# Patient Record
Sex: Male | Born: 1965 | Race: White | Hispanic: No | Marital: Married | Smoking: Never smoker
Health system: Southern US, Community
[De-identification: ages and names within clinical notes are randomized; demographics above are authoritative.]

## PROBLEM LIST (undated history)

## (undated) DIAGNOSIS — N189 Chronic kidney disease, unspecified: Secondary | ICD-10-CM

## (undated) DIAGNOSIS — M109 Gout, unspecified: Secondary | ICD-10-CM

## (undated) DIAGNOSIS — E785 Hyperlipidemia, unspecified: Secondary | ICD-10-CM

## (undated) DIAGNOSIS — I10 Essential (primary) hypertension: Secondary | ICD-10-CM

## (undated) HISTORY — DX: Essential (primary) hypertension: I10

## (undated) HISTORY — DX: Hyperlipidemia, unspecified: E78.5

---

## 2010-07-18 HISTORY — PX: KNEE ARTHROSCOPY: SUR90

## 2010-10-01 ENCOUNTER — Emergency Department (HOSPITAL_COMMUNITY): Payer: BC Managed Care – PPO

## 2010-10-01 ENCOUNTER — Observation Stay (HOSPITAL_COMMUNITY)
Admission: EM | Admit: 2010-10-01 | Discharge: 2010-10-02 | Disposition: A | Discharge status: Home / Self Care | Payer: BC Managed Care – PPO | Attending: Urology | Admitting: Urology

## 2010-10-01 DIAGNOSIS — E7800 Pure hypercholesterolemia, unspecified: Secondary | ICD-10-CM | POA: Insufficient documentation

## 2010-10-01 DIAGNOSIS — N200 Calculus of kidney: Secondary | ICD-10-CM | POA: Insufficient documentation

## 2010-10-01 DIAGNOSIS — M109 Gout, unspecified: Secondary | ICD-10-CM | POA: Insufficient documentation

## 2010-10-01 DIAGNOSIS — N201 Calculus of ureter: Principal | ICD-10-CM | POA: Insufficient documentation

## 2010-10-01 LAB — BASIC METABOLIC PANEL
Chloride: 101 mEq/L (ref 96–112)
GFR calc non Af Amer: 58 mL/min — ABNORMAL LOW (ref >60)
Glucose, Bld: 97 mg/dL (ref 70–99)
Potassium: 4.1 mEq/L (ref 3.5–5.1)
Sodium: 138 mEq/L (ref 135–145)

## 2010-10-01 LAB — URINALYSIS, ROUTINE W REFLEX MICROSCOPIC
Glucose, UA: NEGATIVE mg/dL
Hgb urine dipstick: NEGATIVE
Protein, ur: NEGATIVE mg/dL
Specific Gravity, Urine: 1.021 (ref 1.005–1.030)

## 2010-10-01 LAB — CBC
HCT: 39.9 % (ref 39.0–52.0)
Platelets: 267 10*3/uL (ref 150–400)
RBC: 4.35 MIL/uL (ref 4.22–5.81)
RDW: 12 % (ref 11.5–15.5)
WBC: 13.7 10*3/uL — ABNORMAL HIGH (ref 4.0–10.5)

## 2010-10-02 LAB — URINE CULTURE

## 2010-10-02 LAB — SURGICAL PCR SCREEN: Staphylococcus aureus: POSITIVE — AB

## 2010-10-02 LAB — TYPE AND SCREEN
ABO/RH(D): A POS
Antibody Screen: NEGATIVE

## 2010-10-03 NOTE — Op Note (Signed)
  [name], [name]                ACCOUNT NO.:  [id number]  MEDICAL RECORD NO.:  [id number]           PATIENT TYPE:  O  LOCATION:  1413                         FACILITY:  [facility]  PHYSICIAN:  Heloise Purpura, MD      DATE OF BIRTH:  March 13, 1966  DATE OF PROCEDURE:  10/02/2010 DATE OF DISCHARGE:                              OPERATIVE REPORT   PREOPERATIVE DIAGNOSIS:  Left ureteropelvic junction calculus.  POSTOPERATIVE DIAGNOSIS:  Left ureteropelvic junction calculus.  PROCEDURES: 1. Cystoscopy. 2. Left ureteral stent placement (5 x 24 Polaris).  SURGEON:  Heloise Purpura, MD  ANESTHESIA:  General.  COMPLICATIONS:  None.  INDICATIONS:  David Golden is a 45 year old gentleman who presented with an 8 x 10 mm left UPJ calculus.  Due to persistent nausea and vomiting as well as uncontrolled pain, he was admitted for intravenous fluid hydration and IV pain control.  His pain has persisted and he has elected to proceed with cystoscopy and ureteral stent placement followed by definitive treatment of his stone with shock wave lithotripsy.  The potential risks, complications, and alternative treatment options have been discussed in detail and informed consent obtained.  DESCRIPTION OF PROCEDURE:  The patient was taken to the operating room and a general anesthetic was administered.  He was given preoperative antibiotics, placed in the dorsal lithotomy position, and prepped and draped in the usual sterile fashion.  Next, a preoperative time-out was performed.  Cystourethroscopy was then performed which revealed a normal anterior and posterior urethra.  The distal urethra was mildly narrowed in terms of its caliber and did require serial dilation with Sissy Hoff sounds from 16 up to 24 [place].  The 22-French cystoscope sheath was then placed easily within the bladder and the bladder was systematically examined.  The ureteral orifices were in their normal anatomic positions and no evidence of  any bladder tumors, stones, or other mucosal pathology was identified.  The left ureteral orifice was then identified and intubated with a 6-French ureteral catheter.  The patient's UPJ stone was well visualized on fluoroscopy.  A 0.038 sensor guidewire was then advanced up the ureter into the renal pelvis past the stone without any resistance or difficulty.  A 5 x 24 double-J ureteral Polaris stent was then advanced over the wire using Seldinger technique and positioned appropriately under fluoroscopic and cystoscopic guidance.  The wire was then removed.  The stent was noted to be in the correct position and the bladder was emptied.  He tolerated the procedure well without complications.     Heloise Purpura, MD     LB/MEDQ  D:  10/02/2010  T:  10/02/2010  Job:  [id number]  Electronically Signed by Heloise Purpura MD on 10/03/2010 04:44:51 PM

## 2010-10-03 NOTE — Discharge Summary (Signed)
  NAMEGREER, David Golden                ACCOUNT NO.:  [id number]  MEDICAL RECORD NO.:  [id number]           PATIENT TYPE:  O  LOCATION:  1413                         FACILITY:  [facility]  PHYSICIAN:  Heloise Purpura, MD      DATE OF BIRTH:  [birth year]  DATE OF ADMISSION:  10/01/2010 DATE OF DISCHARGE:  10/02/2010                              DISCHARGE SUMMARY   ADMISSION DIAGNOSES: 1. Left ureteropelvic junction calculus. 2. Bilateral renal calculi.  DISCHARGE DIAGNOSES: 1. Left ureteropelvic junction calculus. 2. Bilateral renal calculi.  HISTORY AND PHYSICAL:  For full details, please see admission history and physical.  Briefly, [name] is a 45 year old gentleman with a longstanding history of urolithiasis.  He presented to the emergency department with acute left-sided flank pain and was found to have a 10 x 8 mm left UPJ calculus along with nonobstructing bilateral renal calculi.  He was admitted for IV fluid hydration and IV pain control. Due to his persistent pain, he subsequently underwent left ureteral stent placement under cystoscopic guidance on October 02, 2010.  His pain was improving and he was subsequently able to be discharged home on October 02, 2010, with oral pain medication.  DISPOSITION:  Home.  DISCHARGE MEDICATIONS:  He will resume his regular home medications.  He has been provided a prescription for Percocet to take as needed for pain and promethazine for nausea.  DISCHARGE INSTRUCTIONS:  He was instructed to reduce his normal diet and activity.  FOLLOWUP:  He will be scheduled for shock wave lithotripsy for definitive treatment of his stone, as previously discussed.     Heloise Purpura, MD     LB/MEDQ  D:  10/02/2010  T:  10/03/2010  Job:  [id number]  Electronically Signed by Heloise Purpura MD on 10/03/2010 04:44:54 PM

## 2010-10-03 NOTE — Consult Note (Signed)
David Golden, David Golden                ACCOUNT NO.:  [id number]  MEDICAL RECORD NO.:  [id number]           PATIENT TYPE:  O  LOCATION:  1413                         FACILITY:  [facility]  PHYSICIAN:  Heloise Purpura, MD      DATE OF BIRTH:  [birth year]  DATE OF CONSULTATION:  10/01/2010 DATE OF DISCHARGE:                                CONSULTATION   REQUESTING PHYSICIAN:  Carleene Cooper, M.D.  REASON FOR CONSULTATION:  Left ureteral calculus.  HISTORY:  David Golden is a 45 year old gentleman from [place] with a longstanding history of kidney stones.  He has had multiple kidney stones and estimates approximately 15 separate episodes over the course of his lifetime.  He has spontaneously passed most of his stones including one as recently as last month.  However, he has required surgical intervention with shock wave lithotripsy twice previously and did require a ureteral stent, one of those times.  His stone type apparently has been calcium oxalate.  His most recent episode began approximately 24 hours ago when he began having severe left-sided flank pain.  This did feel somewhat similar to his prior stone episodes and he developed associated nausea and vomiting.  His pain was uncontrolled and he presented to the emergency department for further evaluation.  He has denied associated hematuria or fever.  PAST MEDICAL HISTORY: 1. Gout. 2. Borderline hypertension. 3. Hypercholesterolemia.  PAST SURGICAL HISTORY: 1. Shock wave lithotripsy for 2 times. 2. Right knee arthroscopy.  MEDICATIONS:  Uloric.  ALLERGIES:  No known drug allergies.  FAMILY HISTORY:  There is a strong family history with multiple family members having a history of urolithiasis.  SOCIAL HISTORY:  He is married and has 2 sons, ages 43 and 78.  He denies alcohol or tobacco use.  REVIEW OF SYSTEMS:  A complete review of systems was reviewed. Pertinent positives are included in the history of present illness.   All other systems are reviewed and otherwise negative.  PHYSICAL EXAMINATION:  GENERAL:  He is currently afebrile with stable vital signs. CONSTITUTIONAL:  Alert and oriented, in no acute distress. HEENT:  Normocephalic, atraumatic. NECK:  Supple. CARDIOVASCULAR:  Regular rate and rhythm without obvious murmurs. LUNGS:  Clear bilaterally. ABDOMEN:  Soft, nontender, and nondistended without abdominal masses or bruits. BACK:  Mild CVA tenderness on the left side.  Of note, he did receive IV Dilaudid within the past hour. EXTREMITIES:  No edema. NEUROLOGIC:  Grossly intact.  LABORATORY DATA:  Serum creatinine 1.34.  White blood count 13.4. Urinalysis is dipstick negative.  RADIOLOGIC IMAGING:  I independently reviewed his CT scan, which demonstrates multiple bilateral renal calculi, which were nonobstructing.  He has an 8 x 10 mm left UPJ calculus with associated left hydronephrosis, which is obstructing.  I also independently reviewed a KUB, which was performed today, which reveals his stones to be radiopaque including the ureteropelvic junction stone, which is well visualized on plain x-ray.  IMPRESSION:  Left ureteropelvic junction calculus.  PLAN:  I have discussed options with the patient, which would include acute management with either outpatient oral medical therapy and pain control versus proceeding  with a ureteral stent.  We have also discussed definitive treatment options including shock wave lithotripsy and ureteroscopic laser lithotripsy.  He would like to proceed with shock wave lithotripsy for definitive management.  He feels that his pain is under relatively good control.  We have reviewed the appropriate stone precautions and he knows to call should he develop uncontrolled pain, persistent nausea and vomiting, or fever greater than 101 degrees Fahrenheit.  He has been prescribed Percocet for pain and promethazine for nausea.  We did review the potential risks  and complications associated with shock wave lithotripsy, which include bleeding, infection, perirenal hematoma, and failure to adequately fragment the stone, etc.  He feels well informed and does elect to proceed in this fashion.  This will be scheduled in the near future.     Heloise Purpura, MD     LB/MEDQ  D:  10/01/2010  T:  10/02/2010  Job:  [id number]  cc:   David Golden, M.D. Fax: [phone]  Electronically Signed by Heloise Purpura MD on 10/03/2010 04:44:47 PM

## 2010-10-07 ENCOUNTER — Ambulatory Visit (HOSPITAL_COMMUNITY): Payer: BC Managed Care – PPO

## 2010-10-07 ENCOUNTER — Ambulatory Visit (HOSPITAL_COMMUNITY)
Admission: RE | Admit: 2010-10-07 | Discharge: 2010-10-07 | Disposition: A | Discharge status: Home / Self Care | Payer: BC Managed Care – PPO | Source: Ambulatory Visit | Attending: Urology | Admitting: Urology

## 2010-10-07 DIAGNOSIS — N200 Calculus of kidney: Secondary | ICD-10-CM | POA: Insufficient documentation

## 2011-09-16 HISTORY — PX: CYSTOSCOPY WITH STENT PLACEMENT: SHX5790

## 2012-11-23 ENCOUNTER — Other Ambulatory Visit: Payer: Self-pay | Admitting: Urology

## 2012-12-26 ENCOUNTER — Encounter (HOSPITAL_COMMUNITY): Payer: Self-pay | Admitting: Pharmacy Technician

## 2012-12-27 ENCOUNTER — Encounter (HOSPITAL_COMMUNITY): Payer: Self-pay | Admitting: *Deleted

## 2012-12-29 NOTE — H&P (Signed)
  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   Prior treatments: Mar 2012: L ESWL (10 mm L UPJ stone)    Past Medical History Problems  1. History of  Gout 274.9 2. History of  Hypercholesterolemia 272.0 3. History of  Oral Surgery Tooth Extraction  Surgical History Problems  1. History of  Arthroscopy Knee Right 2. History of  Cystoscopy With Insertion Of Ureteral Stent Left 3. History of  Knee Arthroscopy 4. History of  Lithotripsy  Current Meds 1. Advil TABS; as needed; Therapy: (Recorded:28Mar2013) to 2. Hydrocodone-Acetaminophen 5-500 MG Oral Tablet; Therapy: (Recorded:28Mar2013) to 3. Multivitamins TABS; Therapy: (Recorded:28Mar2012) to 4. Oxycodone-Acetaminophen 5-325 MG Oral Tablet; TAKE 1 TO 2 TABLETS EVERY 4 HOURS AS  NEEDED FOR PAIN; Therapy: 28Mar2013 to (Last Rx:28Mar2013) 5. Promethazine HCl 25 MG Oral Tablet; TAKE 1 TABLET EVERY 4 TO 6 HOURS AS NEEDED;  Therapy: 28Mar2013 to (Evaluate:04Apr2013)  Requested for: 28Mar2013; Last Rx:28Mar2013  Allergies Medication  1. No Known Drug Allergies  Family History Problems  1. Paternal history of  Acute Myelogenous Leukemia V16.6 2. Paternal history of  Coronary Artery Disease V17.49 3. Maternal history of  Urinary Calculus  Social History Problems  1. Marital History - Currently Married 2. Never A Smoker Denied  3. History of  Alcohol Use 4. History of  Tobacco Use  Vitals  BMI Calculated: 29.12 BSA Calculated: 2.2 Height: 6 ft  Weight: 215 lb    Physical Exam Constitutional: Well nourished and well developed . No acute distress.  ENT:. The ears and nose are normal in appearance.  Neck: The appearance of the neck is normal and no neck mass is  present.  Pulmonary: No respiratory distress and normal respiratory rhythm and effort.  Cardiovascular: Heart rate and rhythm are normal . No peripheral edema.  Abdomen: The abdomen is soft and nontender. No masses are palpated. No CVA tenderness. No hernias are palpable. No hepatosplenomegaly noted.  Neuro/Psych:. Mood and affect are appropriate.    Results/Data Urine [Data Includes: Last 1 Day]   11Apr2014  COLOR YELLOW   APPEARANCE CLEAR   SPECIFIC GRAVITY 1.020   pH 6.0   GLUCOSE NEG mg/dL  BILIRUBIN NEG   KETONE NEG mg/dL  BLOOD NEG   PROTEIN NEG mg/dL  UROBILINOGEN 0.2 mg/dL  NITRITE NEG   LEUKOCYTE ESTERASE NEG    There are 2 radiopaque calculi in the right kidney within upper pole calculus measuring 10.7 mm and a lower pole calculus measuring approximately 3 mm. These are nonobstructing positions. No calcifications are seen over the left renal shadow or over the expected course of the ureters bilaterally. In comparison, his right renal calculus measured approximately 8.6 mm last year.   Assessment Assessed  1. Nephrolithiasis 592.0   Discussion/Summary 1. Right renal calculi: He does have evidence of stone growth over the past year. We discussed options including continued observation versus intervention with treatment options including percutaneous nephrolithotomy, ureteroscopic laser lithotripsy, and shockwave lithotripsy. We discussed the pros and cons of each approach and he does wish to proceed with proactive therapy was shockwave lithotripsy. We have reviewed the potential risks, complications, expected success rates, and expected recovery process associated with this procedure.

## 2012-12-31 ENCOUNTER — Ambulatory Visit (HOSPITAL_COMMUNITY)
Admission: RE | Admit: 2012-12-31 | Discharge: 2012-12-31 | Disposition: A | Discharge status: Home / Self Care | Payer: BC Managed Care – PPO | Source: Ambulatory Visit | Attending: Urology | Admitting: Urology

## 2012-12-31 ENCOUNTER — Ambulatory Visit (HOSPITAL_COMMUNITY): Payer: BC Managed Care – PPO

## 2012-12-31 ENCOUNTER — Encounter (HOSPITAL_COMMUNITY)
Admission: RE | Disposition: A | Discharge status: Home / Self Care | Payer: Self-pay | Source: Ambulatory Visit | Attending: Urology

## 2012-12-31 ENCOUNTER — Encounter (HOSPITAL_COMMUNITY): Payer: Self-pay | Admitting: *Deleted

## 2012-12-31 DIAGNOSIS — Z8639 Personal history of other endocrine, nutritional and metabolic disease: Secondary | ICD-10-CM | POA: Insufficient documentation

## 2012-12-31 DIAGNOSIS — Z87442 Personal history of urinary calculi: Secondary | ICD-10-CM | POA: Insufficient documentation

## 2012-12-31 DIAGNOSIS — Z79899 Other long term (current) drug therapy: Secondary | ICD-10-CM | POA: Insufficient documentation

## 2012-12-31 DIAGNOSIS — N200 Calculus of kidney: Secondary | ICD-10-CM | POA: Insufficient documentation

## 2012-12-31 DIAGNOSIS — E7800 Pure hypercholesterolemia, unspecified: Secondary | ICD-10-CM | POA: Insufficient documentation

## 2012-12-31 DIAGNOSIS — Z862 Personal history of diseases of the blood and blood-forming organs and certain disorders involving the immune mechanism: Secondary | ICD-10-CM | POA: Insufficient documentation

## 2012-12-31 HISTORY — DX: Gout, unspecified: M10.9

## 2012-12-31 HISTORY — DX: Chronic kidney disease, unspecified: N18.9

## 2012-12-31 SURGERY — LITHOTRIPSY, ESWL
Anesthesia: LOCAL | Laterality: Right

## 2012-12-31 MED ORDER — CIPROFLOXACIN HCL 500 MG PO TABS
500.0000 mg | ORAL_TABLET | ORAL | Status: AC
  Administered 2012-12-31: 500 mg via ORAL
  Filled 2012-12-31: qty 1

## 2012-12-31 MED ORDER — DIPHENHYDRAMINE HCL 25 MG PO CAPS
25.0000 mg | ORAL_CAPSULE | ORAL | Status: AC
  Administered 2012-12-31: 25 mg via ORAL
  Filled 2012-12-31: qty 1

## 2012-12-31 MED ORDER — DEXTROSE-NACL 5-0.45 % IV SOLN
INTRAVENOUS | Status: DC
  Administered 2012-12-31: 17:00:00 via INTRAVENOUS

## 2012-12-31 MED ORDER — DIAZEPAM 5 MG PO TABS
10.0000 mg | ORAL_TABLET | ORAL | Status: AC
  Administered 2012-12-31: 10 mg via ORAL
  Filled 2012-12-31: qty 2

## 2012-12-31 NOTE — Op Note (Signed)
See paper chart for operative note for ESWL. 

## 2013-03-09 IMAGING — CR DG ABDOMEN 1V
1 series · 1 of 1 positions shown · non-contrast
Comparison: 10/01/2010

CLINICAL DATA: Kidney stones.  Pre lithotripsy.

ABDOMEN - 1 VIEW

[t abdomen supine]
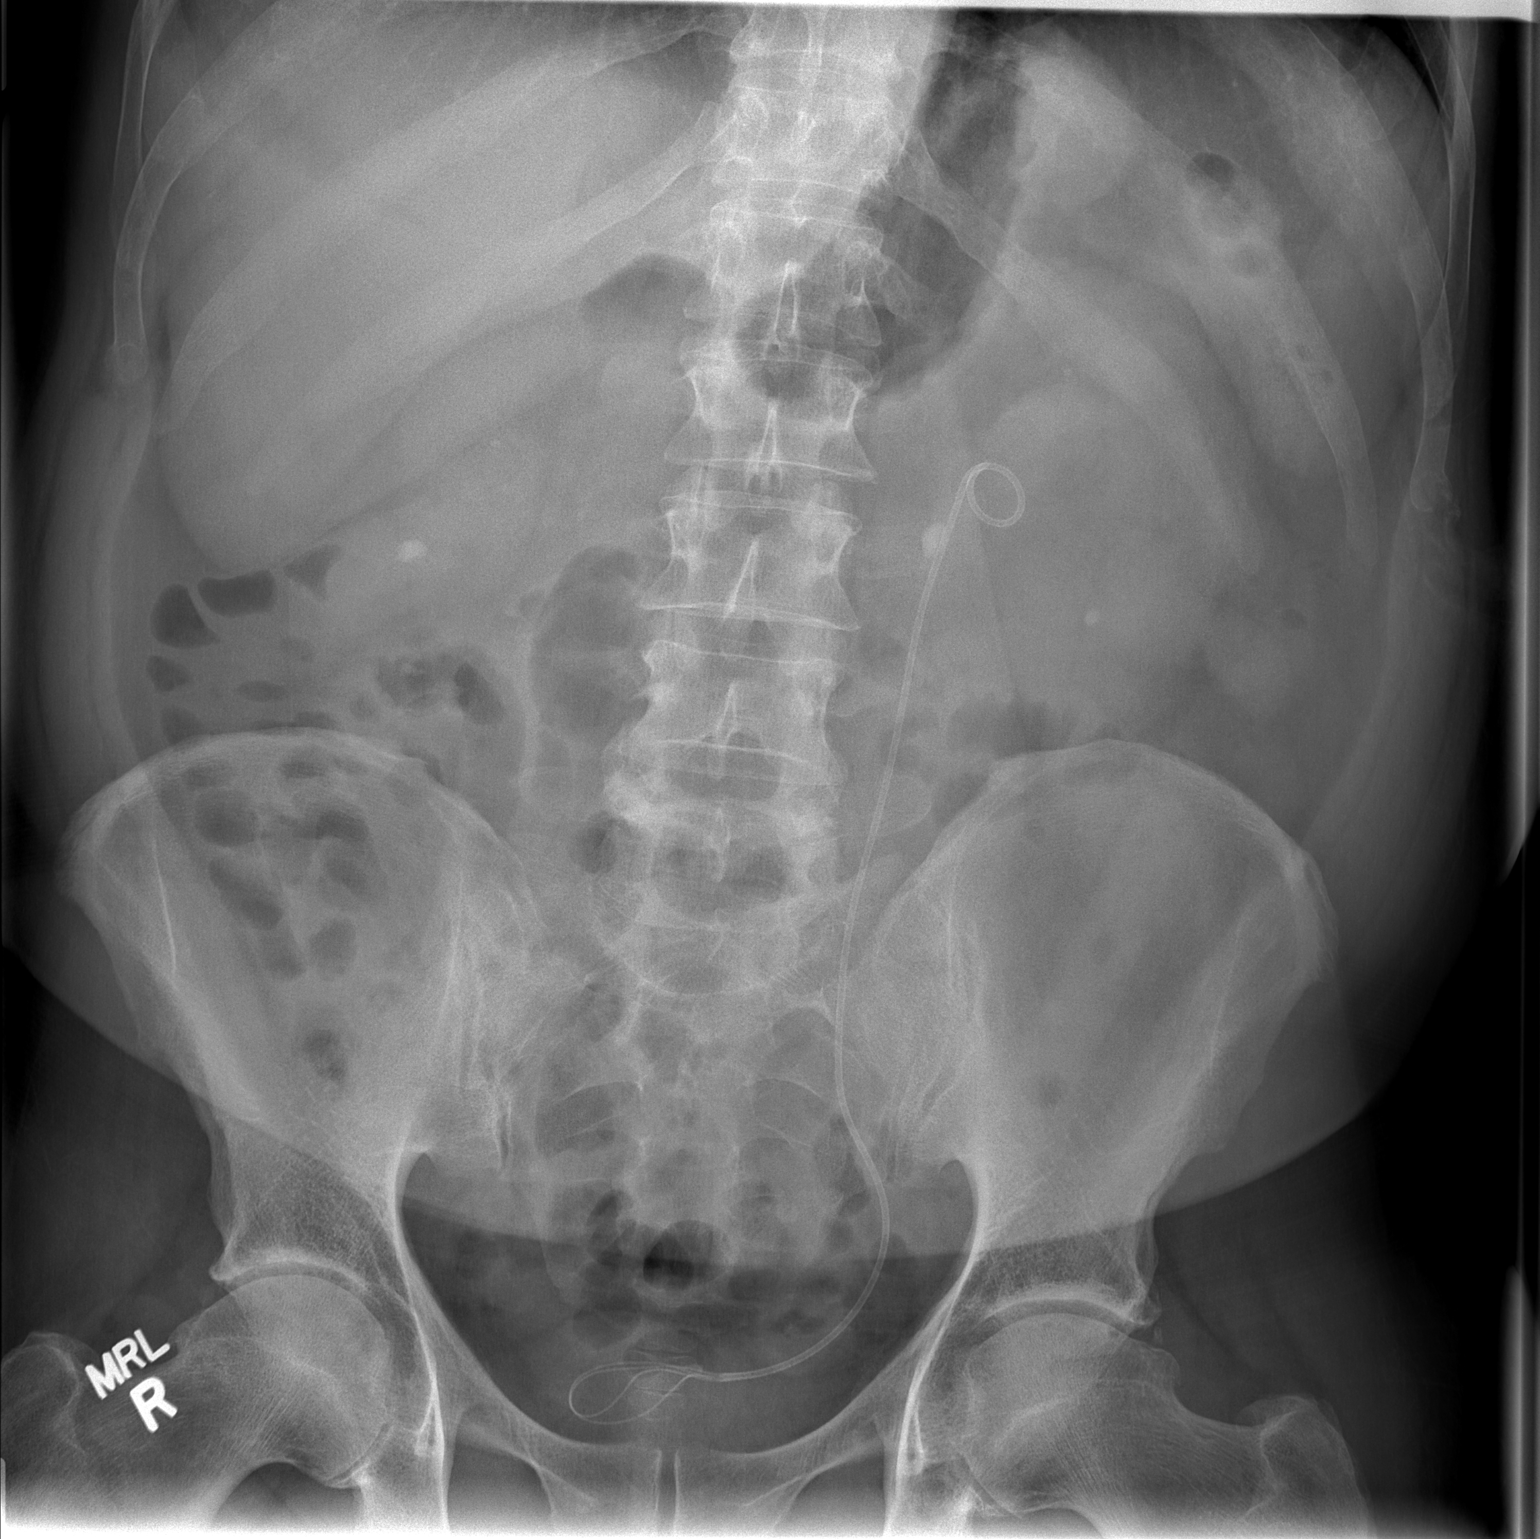

[1 of 1 positions shown; findings below may reference images not displayed]

FINDINGS: A left ureteral stent has been placed.    Proximal aspect
of the stent is reconstituted at the left kidney.  Distal aspect of
stent is in the region of the urinary bladder.  Again seen are
bilateral renal calculi.  There is a stable large stone in the
proximal left ureter that measures up to 1.2 cm.  There is a 0.5 cm
stone in the left kidney lower pole.  1 cm stone overlying the
right kidney.  Nonspecific bowel gas pattern.
IMPRESSION: Stable appearance of bilateral renal stones.

Interval placement of left ureteral stent.

## 2015-06-03 IMAGING — CR DG ABDOMEN 1V
1 series · 1 of 1 positions shown · non-contrast
Comparison: Office radiographs 10/26/2012.  CT urogram 10/13/2011.

CLINICAL DATA: Right renal calculus.  Pre lithotripsy.

ABDOMEN - 1 VIEW

[t abdomen supine]
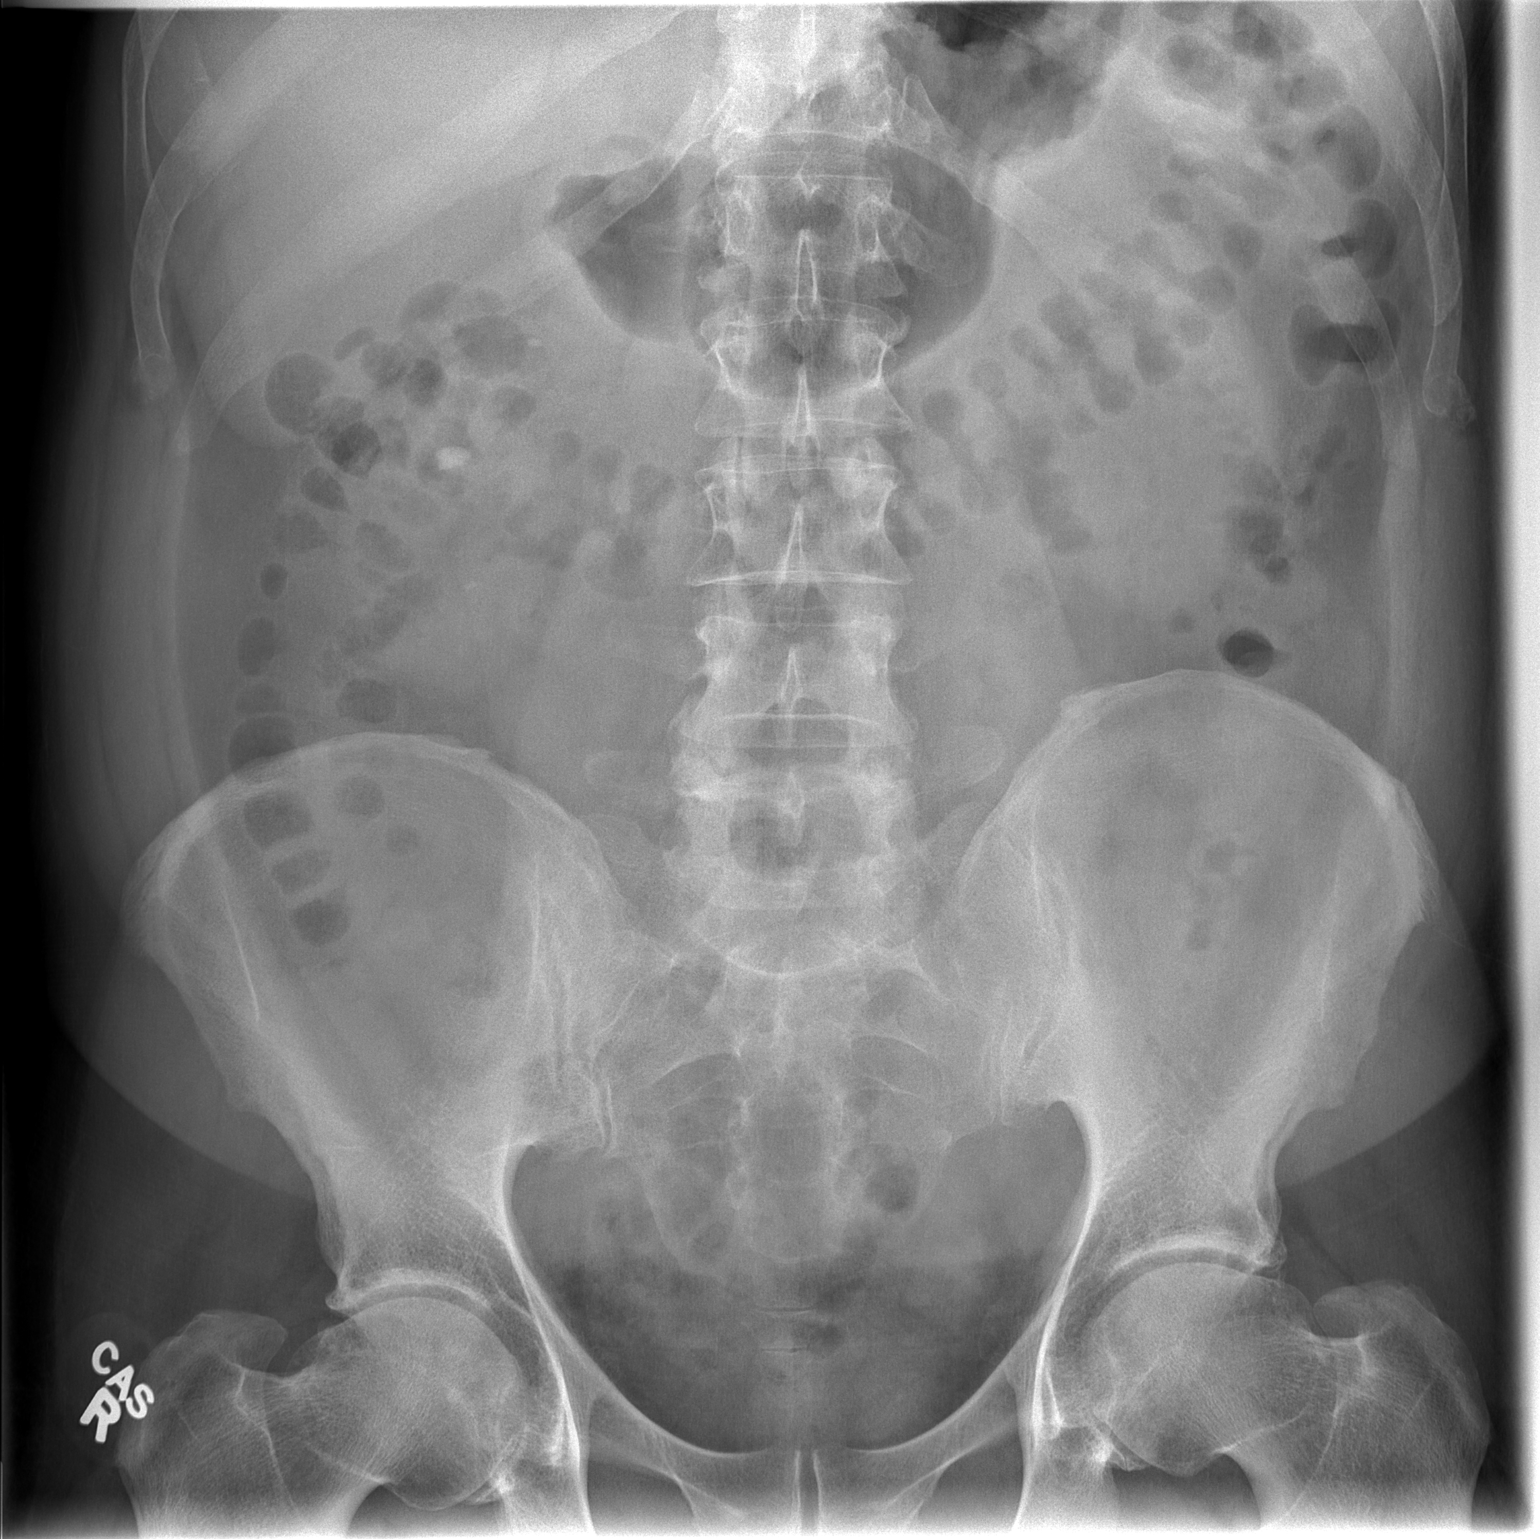

[1 of 1 positions shown; findings below may reference images not displayed]

FINDINGS: Dominant 9 mm calculus in the interpolar region of the
right kidney is unchanged.  There are other smaller calculi in the
upper and lower poles of the right kidney.  Previously noted left
renal calculi on CT are not well visualized.  No ureteral calculi
are evident.  The bowel gas pattern is normal.  Mild degenerative
changes are present in both hips.
IMPRESSION: Stable right renal calculi.  No radiographic evidence of ureteral
calculus.

## 2016-12-19 DIAGNOSIS — E785 Hyperlipidemia, unspecified: Secondary | ICD-10-CM | POA: Diagnosis not present

## 2016-12-19 DIAGNOSIS — Z125 Encounter for screening for malignant neoplasm of prostate: Secondary | ICD-10-CM | POA: Diagnosis not present

## 2016-12-19 DIAGNOSIS — I10 Essential (primary) hypertension: Secondary | ICD-10-CM | POA: Diagnosis not present

## 2016-12-19 DIAGNOSIS — Z79899 Other long term (current) drug therapy: Secondary | ICD-10-CM | POA: Diagnosis not present

## 2017-06-19 DIAGNOSIS — Z2821 Immunization not carried out because of patient refusal: Secondary | ICD-10-CM | POA: Diagnosis not present

## 2017-06-19 DIAGNOSIS — Z79899 Other long term (current) drug therapy: Secondary | ICD-10-CM | POA: Diagnosis not present

## 2017-06-19 DIAGNOSIS — E785 Hyperlipidemia, unspecified: Secondary | ICD-10-CM | POA: Diagnosis not present

## 2017-06-19 DIAGNOSIS — I10 Essential (primary) hypertension: Secondary | ICD-10-CM | POA: Diagnosis not present

## 2017-06-19 DIAGNOSIS — M109 Gout, unspecified: Secondary | ICD-10-CM | POA: Diagnosis not present

## 2017-10-31 DIAGNOSIS — R05 Cough: Secondary | ICD-10-CM | POA: Diagnosis not present

## 2017-10-31 DIAGNOSIS — R6889 Other general symptoms and signs: Secondary | ICD-10-CM | POA: Diagnosis not present

## 2017-12-26 DIAGNOSIS — Z131 Encounter for screening for diabetes mellitus: Secondary | ICD-10-CM | POA: Diagnosis not present

## 2017-12-26 DIAGNOSIS — Z79899 Other long term (current) drug therapy: Secondary | ICD-10-CM | POA: Diagnosis not present

## 2017-12-26 DIAGNOSIS — I10 Essential (primary) hypertension: Secondary | ICD-10-CM | POA: Diagnosis not present

## 2017-12-26 DIAGNOSIS — E785 Hyperlipidemia, unspecified: Secondary | ICD-10-CM | POA: Diagnosis not present

## 2018-06-27 DIAGNOSIS — E785 Hyperlipidemia, unspecified: Secondary | ICD-10-CM | POA: Diagnosis not present

## 2018-06-27 DIAGNOSIS — Z79899 Other long term (current) drug therapy: Secondary | ICD-10-CM | POA: Diagnosis not present

## 2018-06-27 DIAGNOSIS — Z23 Encounter for immunization: Secondary | ICD-10-CM | POA: Diagnosis not present

## 2018-06-27 DIAGNOSIS — Z131 Encounter for screening for diabetes mellitus: Secondary | ICD-10-CM | POA: Diagnosis not present

## 2018-06-27 DIAGNOSIS — I10 Essential (primary) hypertension: Secondary | ICD-10-CM | POA: Diagnosis not present

## 2018-07-04 DIAGNOSIS — Z1212 Encounter for screening for malignant neoplasm of rectum: Secondary | ICD-10-CM | POA: Diagnosis not present

## 2018-07-04 DIAGNOSIS — Z1211 Encounter for screening for malignant neoplasm of colon: Secondary | ICD-10-CM | POA: Diagnosis not present

## 2018-11-02 DIAGNOSIS — M109 Gout, unspecified: Secondary | ICD-10-CM | POA: Diagnosis not present

## 2019-07-01 DIAGNOSIS — E785 Hyperlipidemia, unspecified: Secondary | ICD-10-CM | POA: Diagnosis not present

## 2019-07-01 DIAGNOSIS — I10 Essential (primary) hypertension: Secondary | ICD-10-CM | POA: Diagnosis not present

## 2019-07-01 DIAGNOSIS — Z1331 Encounter for screening for depression: Secondary | ICD-10-CM | POA: Diagnosis not present

## 2019-07-01 DIAGNOSIS — R05 Cough: Secondary | ICD-10-CM | POA: Diagnosis not present

## 2019-07-01 DIAGNOSIS — M109 Gout, unspecified: Secondary | ICD-10-CM | POA: Diagnosis not present

## 2019-07-05 DIAGNOSIS — R05 Cough: Secondary | ICD-10-CM | POA: Diagnosis not present

## 2019-07-05 DIAGNOSIS — J1289 Other viral pneumonia: Secondary | ICD-10-CM | POA: Diagnosis not present

## 2019-07-05 DIAGNOSIS — U071 COVID-19: Secondary | ICD-10-CM | POA: Diagnosis not present

## 2019-07-05 DIAGNOSIS — R0600 Dyspnea, unspecified: Secondary | ICD-10-CM | POA: Diagnosis not present

## 2019-07-07 DIAGNOSIS — B342 Coronavirus infection, unspecified: Secondary | ICD-10-CM | POA: Insufficient documentation

## 2019-07-07 HISTORY — DX: Coronavirus infection, unspecified: B34.2

## 2019-10-15 DIAGNOSIS — Z6830 Body mass index (BMI) 30.0-30.9, adult: Secondary | ICD-10-CM | POA: Diagnosis not present

## 2019-10-15 DIAGNOSIS — E785 Hyperlipidemia, unspecified: Secondary | ICD-10-CM | POA: Diagnosis not present

## 2019-10-15 DIAGNOSIS — I10 Essential (primary) hypertension: Secondary | ICD-10-CM | POA: Diagnosis not present

## 2019-10-15 DIAGNOSIS — M109 Gout, unspecified: Secondary | ICD-10-CM | POA: Diagnosis not present

## 2019-12-30 DIAGNOSIS — J0100 Acute maxillary sinusitis, unspecified: Secondary | ICD-10-CM | POA: Diagnosis not present

## 2019-12-30 DIAGNOSIS — Z79899 Other long term (current) drug therapy: Secondary | ICD-10-CM | POA: Diagnosis not present

## 2020-03-09 DIAGNOSIS — E785 Hyperlipidemia, unspecified: Secondary | ICD-10-CM | POA: Diagnosis not present

## 2020-03-09 DIAGNOSIS — Z79899 Other long term (current) drug therapy: Secondary | ICD-10-CM | POA: Diagnosis not present

## 2020-03-09 DIAGNOSIS — I10 Essential (primary) hypertension: Secondary | ICD-10-CM | POA: Diagnosis not present

## 2020-03-09 DIAGNOSIS — Z6830 Body mass index (BMI) 30.0-30.9, adult: Secondary | ICD-10-CM | POA: Diagnosis not present

## 2020-03-09 DIAGNOSIS — Z125 Encounter for screening for malignant neoplasm of prostate: Secondary | ICD-10-CM | POA: Diagnosis not present

## 2020-07-30 DIAGNOSIS — Z1331 Encounter for screening for depression: Secondary | ICD-10-CM | POA: Diagnosis not present

## 2020-07-30 DIAGNOSIS — Z125 Encounter for screening for malignant neoplasm of prostate: Secondary | ICD-10-CM | POA: Diagnosis not present

## 2020-07-30 DIAGNOSIS — Z79899 Other long term (current) drug therapy: Secondary | ICD-10-CM | POA: Diagnosis not present

## 2020-07-30 DIAGNOSIS — I10 Essential (primary) hypertension: Secondary | ICD-10-CM | POA: Diagnosis not present

## 2020-07-30 DIAGNOSIS — E785 Hyperlipidemia, unspecified: Secondary | ICD-10-CM | POA: Diagnosis not present

## 2020-07-30 DIAGNOSIS — Z23 Encounter for immunization: Secondary | ICD-10-CM | POA: Diagnosis not present

## 2020-08-03 DIAGNOSIS — M25561 Pain in right knee: Secondary | ICD-10-CM | POA: Insufficient documentation

## 2020-08-07 DIAGNOSIS — M1711 Unilateral primary osteoarthritis, right knee: Secondary | ICD-10-CM | POA: Diagnosis not present

## 2020-09-04 DIAGNOSIS — M25561 Pain in right knee: Secondary | ICD-10-CM | POA: Diagnosis not present

## 2020-10-30 DIAGNOSIS — M25561 Pain in right knee: Secondary | ICD-10-CM | POA: Diagnosis not present

## 2020-11-04 DIAGNOSIS — Z6831 Body mass index (BMI) 31.0-31.9, adult: Secondary | ICD-10-CM | POA: Diagnosis not present

## 2020-11-04 DIAGNOSIS — J302 Other seasonal allergic rhinitis: Secondary | ICD-10-CM | POA: Diagnosis not present

## 2020-11-04 DIAGNOSIS — M109 Gout, unspecified: Secondary | ICD-10-CM | POA: Diagnosis not present

## 2020-11-04 DIAGNOSIS — Z79899 Other long term (current) drug therapy: Secondary | ICD-10-CM | POA: Diagnosis not present

## 2020-11-26 DIAGNOSIS — Z79899 Other long term (current) drug therapy: Secondary | ICD-10-CM | POA: Diagnosis not present

## 2020-11-26 DIAGNOSIS — N400 Enlarged prostate without lower urinary tract symptoms: Secondary | ICD-10-CM | POA: Diagnosis not present

## 2020-11-26 DIAGNOSIS — I10 Essential (primary) hypertension: Secondary | ICD-10-CM | POA: Diagnosis not present

## 2020-11-26 DIAGNOSIS — E785 Hyperlipidemia, unspecified: Secondary | ICD-10-CM | POA: Diagnosis not present

## 2021-01-27 DIAGNOSIS — M25561 Pain in right knee: Secondary | ICD-10-CM | POA: Diagnosis not present

## 2021-03-31 DIAGNOSIS — Z79899 Other long term (current) drug therapy: Secondary | ICD-10-CM | POA: Diagnosis not present

## 2021-03-31 DIAGNOSIS — I10 Essential (primary) hypertension: Secondary | ICD-10-CM | POA: Diagnosis not present

## 2021-03-31 DIAGNOSIS — N400 Enlarged prostate without lower urinary tract symptoms: Secondary | ICD-10-CM | POA: Diagnosis not present

## 2021-03-31 DIAGNOSIS — E785 Hyperlipidemia, unspecified: Secondary | ICD-10-CM | POA: Diagnosis not present

## 2021-06-01 DIAGNOSIS — M1711 Unilateral primary osteoarthritis, right knee: Secondary | ICD-10-CM | POA: Insufficient documentation

## 2021-06-03 DIAGNOSIS — M1711 Unilateral primary osteoarthritis, right knee: Secondary | ICD-10-CM | POA: Diagnosis not present

## 2021-06-03 DIAGNOSIS — M170 Bilateral primary osteoarthritis of knee: Secondary | ICD-10-CM | POA: Diagnosis not present

## 2021-08-19 DIAGNOSIS — Z79899 Other long term (current) drug therapy: Secondary | ICD-10-CM | POA: Diagnosis not present

## 2021-08-19 DIAGNOSIS — Z1331 Encounter for screening for depression: Secondary | ICD-10-CM | POA: Diagnosis not present

## 2021-08-19 DIAGNOSIS — N400 Enlarged prostate without lower urinary tract symptoms: Secondary | ICD-10-CM | POA: Diagnosis not present

## 2021-08-19 DIAGNOSIS — E785 Hyperlipidemia, unspecified: Secondary | ICD-10-CM | POA: Diagnosis not present

## 2021-08-19 DIAGNOSIS — I10 Essential (primary) hypertension: Secondary | ICD-10-CM | POA: Diagnosis not present

## 2021-09-30 DIAGNOSIS — M1711 Unilateral primary osteoarthritis, right knee: Secondary | ICD-10-CM | POA: Diagnosis not present

## 2021-10-07 DIAGNOSIS — Z1212 Encounter for screening for malignant neoplasm of rectum: Secondary | ICD-10-CM | POA: Diagnosis not present

## 2021-10-07 DIAGNOSIS — M1711 Unilateral primary osteoarthritis, right knee: Secondary | ICD-10-CM | POA: Diagnosis not present

## 2021-10-07 DIAGNOSIS — Z1211 Encounter for screening for malignant neoplasm of colon: Secondary | ICD-10-CM | POA: Diagnosis not present

## 2021-10-14 DIAGNOSIS — M1711 Unilateral primary osteoarthritis, right knee: Secondary | ICD-10-CM | POA: Diagnosis not present

## 2021-10-16 LAB — EXTERNAL GENERIC LAB PROCEDURE: COLOGUARD: NEGATIVE

## 2021-10-16 LAB — COLOGUARD: COLOGUARD: NEGATIVE

## 2021-10-20 DIAGNOSIS — M25561 Pain in right knee: Secondary | ICD-10-CM | POA: Diagnosis not present

## 2021-10-20 DIAGNOSIS — S76311A Strain of muscle, fascia and tendon of the posterior muscle group at thigh level, right thigh, initial encounter: Secondary | ICD-10-CM | POA: Diagnosis not present

## 2021-12-20 DIAGNOSIS — E785 Hyperlipidemia, unspecified: Secondary | ICD-10-CM | POA: Diagnosis not present

## 2021-12-20 DIAGNOSIS — R079 Chest pain, unspecified: Secondary | ICD-10-CM | POA: Diagnosis not present

## 2021-12-20 DIAGNOSIS — I10 Essential (primary) hypertension: Secondary | ICD-10-CM | POA: Diagnosis not present

## 2021-12-20 DIAGNOSIS — Z6831 Body mass index (BMI) 31.0-31.9, adult: Secondary | ICD-10-CM | POA: Diagnosis not present

## 2021-12-20 DIAGNOSIS — N400 Enlarged prostate without lower urinary tract symptoms: Secondary | ICD-10-CM | POA: Diagnosis not present

## 2021-12-28 ENCOUNTER — Encounter: Payer: Self-pay | Admitting: *Deleted

## 2021-12-28 ENCOUNTER — Encounter: Payer: Self-pay | Admitting: Cardiology

## 2021-12-28 DIAGNOSIS — M109 Gout, unspecified: Secondary | ICD-10-CM | POA: Insufficient documentation

## 2021-12-28 DIAGNOSIS — N189 Chronic kidney disease, unspecified: Secondary | ICD-10-CM | POA: Insufficient documentation

## 2022-02-11 ENCOUNTER — Ambulatory Visit: Payer: BC Managed Care – PPO | Admitting: Cardiology

## 2022-02-18 ENCOUNTER — Ambulatory Visit: Payer: BC Managed Care – PPO | Admitting: Cardiology

## 2022-02-18 ENCOUNTER — Encounter: Payer: Self-pay | Admitting: Cardiology

## 2022-02-18 VITALS — BP 142/92 | HR 86 | Ht 71.0 in | Wt 213.0 lb

## 2022-02-18 DIAGNOSIS — R079 Chest pain, unspecified: Secondary | ICD-10-CM | POA: Insufficient documentation

## 2022-02-18 DIAGNOSIS — R0609 Other forms of dyspnea: Secondary | ICD-10-CM

## 2022-02-18 DIAGNOSIS — R072 Precordial pain: Secondary | ICD-10-CM | POA: Diagnosis not present

## 2022-02-18 DIAGNOSIS — I10 Essential (primary) hypertension: Secondary | ICD-10-CM | POA: Diagnosis not present

## 2022-02-18 MED ORDER — METOPROLOL TARTRATE 100 MG PO TABS: ORAL_TABLET | ORAL | 0 refills | Status: DC

## 2022-02-18 NOTE — Progress Notes (Signed)
Cardiology Consultation:    Date:  02/18/2022   ID:  David Golden, DOB [birth year], MRN [id number]  PCP:  Wilmer Floor., MD  Cardiologist:  Gypsy Balsam, MD   Referring MD: Paulina Fusi, MD   Chief Complaint  Patient presents with   Chest Pain    Ongoing for few months/ fam h/o with heart disease    knee pain, stiffness    Ongoing for years     History of Present Illness:    David Golden is a 56 y.o. male who is being seen today for the evaluation of chest pain month ago at the request of Paulina Fusi, MD. past medical history significant for essential hypertension, dyspnea on exertion, gout, family history of premature coronary artery disease multiple family members, his cholesterol is quite good.  He was referred to [place] because he did have some episode of chest pain.  That happened about a month ago he said those episode of pain will happen maybe 3-4 times usually when he was sitting tightness pressure burning in the chest this sensation when he felt lasting for couple minutes.  There was no sweating there is no shortness of breath associated with this sensation.  He blames this on stress because of moving.  For last month he did not have any more sensation like that.  He used to be active and exercise on the regular basis that he used to work on [employer].  However does not do it anymore he does have a membership to the gym but does not go there.  He is not on any special diet.  He does have hypertension look like successfully managed with medications.  Past Medical History:  Diagnosis Date   Chronic kidney disease    right  kidney stone   Coronavirus infection 07/07/2019   Formatting of this note might be different from the original. Notification of POS result Isolation since day of testing of patient and family members Local Health Dept will call with specific instructions Conv Care their POC until the LHD contacts them Obtaining testing for symptomatic  Family Members Discuss self-isolation measures Discuss in-home separation and safety measures Discuss web-based C   Gout 6 months ago   Hyperlipidemia    Hypertension     Past Surgical History:  Procedure Laterality Date   CYSTOSCOPY WITH STENT PLACEMENT  march 2013   KNEE ARTHROSCOPY Bilateral 2012    Current Medications: Current Meds  Medication Sig   allopurinol (ZYLOPRIM) 100 MG tablet Take 100 mg by mouth daily.   lisinopril (ZESTRIL) 10 MG tablet Take 10 mg by mouth daily.   metoprolol succinate (TOPROL-XL) 25 MG 24 hr tablet Take 25 mg by mouth daily.   [DISCONTINUED] Homeopathic Products (ALLERGY MEDICINE PO) Take 1 tablet by mouth daily.     Allergies:   Patient has no known allergies.   Social History   Socioeconomic History   Marital status: Married    Spouse name: Not on file   Number of children: Not on file   Years of education: Not on file   Highest education level: Not on file  Occupational History   Not on file  Tobacco Use   Smoking status: Never   Smokeless tobacco: Not on file  Substance and Sexual Activity   Alcohol use: No   Drug use: No   Sexual activity: Yes  Other Topics Concern   Not on file  Social History Narrative   Not on  file   Social Determinants of Health   Financial Resource Strain: Not on file  Food Insecurity: Not on file  Transportation Needs: Not on file  Physical Activity: Not on file  Stress: Not on file  Social Connections: Not on file     Family History: The patient's family history includes Arthritis in his father; Cancer in his father; Heart attack in his father; Heart disease in his father and mother. ROS:   Please see the history of present illness.    All 14 point review of systems negative except as described per history of present illness.  EKGs/Labs/Other Studies Reviewed:    The following studies were reviewed today: EKG today showed normal sinus rhythm normal P interval, cannot rule out anterior wall  MI with poor R wave progression anterior precordium.    Recent Labs: No results found for requested labs within last 365 days.  Recent Lipid Panel No results found for: "CHOL", "TRIG", "HDL", "CHOLHDL", "VLDL", "LDLCALC", "LDLDIRECT"  Physical Exam:    VS:  BP (!) 142/92 (BP Location: Left Arm, Patient Position: Sitting)   Pulse 86   Ht 5\' 11"  (1.803 m)   Wt 213 lb (96.6 kg)   SpO2 96%   BMI 29.71 kg/m     Wt Readings from Last 3 Encounters:  02/18/22 213 lb (96.6 kg)  12/20/21 211 lb (95.7 kg)  12/31/12 212 lb 8 oz (96.4 kg)     GEN:  Well nourished, well developed in no acute distress HEENT: Normal NECK: No JVD; No carotid bruits LYMPHATICS: No lymphadenopathy CARDIAC: RRR, no murmurs, no rubs, no gallops RESPIRATORY:  Clear to auscultation without rales, wheezing or rhonchi  ABDOMEN: Soft, non-tender, non-distended MUSCULOSKELETAL:  No edema; No deformity  SKIN: Warm and dry NEUROLOGIC:  Alert and oriented x 3 PSYCHIATRIC:  Normal affect   ASSESSMENT:    1. Chest pain of uncertain etiology   2. Essential hypertension   3. Dyspnea on exertion    PLAN:    In order of problems listed above:  Chest pain in this gentleman with multiple risk factors for coronary artery disease.  We had discussion about what to do with the situation first of all he is taking already aspirin I want him to continue, he states he is completely asymptomatic right now no need for antianginal therapy however because of multiple risk factors I think it will be reasonable to do coronary CT angio make sure he does not have any obstructive disease.  CC EKG showed possibility of anterior wall MI echocardiogram will be performed to rule it out. Dyslipidemia I did review his K PN which only his cholesterol being quite good actually he is LDL 85 HDL 40 but if we find some coronary artery disease that need to be better control with LDL less than 70 for now I will keep him at present dosages of  medications We initiated conversation about need to exercise on the regular basis will continue this discussion with some instruction how to do good diet and good exercise   Medication Adjustments/Labs and Tests Ordered: Current medicines are reviewed at length with the patient today.  Concerns regarding medicines are outlined above.  No orders of the defined types were placed in this encounter.  No orders of the defined types were placed in this encounter.   Signed, 01/02/13, MD, [facility]. 02/18/2022 3:38 PM    [facility]

## 2022-02-18 NOTE — Addendum Note (Signed)
Addended by: Baldo Ash D on: 02/18/2022 03:58 PM   Modules accepted: Orders

## 2022-02-18 NOTE — Patient Instructions (Signed)
Medication Instructions:   Metoprolol 100mg  2 hours prior to CT Scan  *If you need a refill on your cardiac medications before your next appointment, please call your pharmacy*   Lab Work: BMP 1 week prior to CT Scan  You can come Monday through Friday 8:30 am to 12:00 pm and 1:15 to 4:30. You do not need to make an appointment as the order has already been placed. The labs you are going to have done are BMET   Testing/Procedures:   Your cardiac CT will be scheduled at one of the below locations:   [facility] [address] [place], [place] [phone]   At [facility], please arrive at the [facility] Entrance (Entrance C2) of [facility] 30 minutes prior to test start time. You can use the FREE valet parking offered at entrance C (encouraged to control the heart rate for the test)  Proceed to the [facility] Radiology Department (first floor) to check-in and test prep.  All radiology patients and guests should use entrance C2 at [facility], accessed from [facility], even though the hospital's physical address listed is [address]      Please follow these instructions carefully (unless otherwise directed):  Hold all erectile dysfunction medications at least 3 days (72 hrs) prior to test.  On the Night Before the Test: Be sure to Drink plenty of water. Do not consume any caffeinated/decaffeinated beverages or chocolate 12 hours prior to your test. Do not take any antihistamines 12 hours prior to your test.   On the Day of the Test: Drink plenty of water until 1 hour prior to the test. Do not eat any food 4 hours prior to the test. You may take your regular medications prior to the test.  Take metoprolol (Lopressor) two hours prior to test.       After the Test: Drink plenty of water. After receiving IV contrast, you may experience a mild flushed feeling. This is normal. On  occasion, you may experience a mild rash up to 24 hours after the test. This is not dangerous. If this occurs, you can take Benadryl 25 mg and increase your fluid intake. If you experience trouble breathing, this can be serious. If it is severe call 911 IMMEDIATELY. If it is mild, please call our office. If you take any of these medications: Glipizide/Metformin, Avandament, Glucavance, please do not take 48 hours after completing test unless otherwise instructed.  We will call to schedule your test 2-4 weeks out understanding that some insurance companies will need an authorization prior to the service being performed.   For non-scheduling related questions, please contact the cardiac imaging nurse navigator should you have any questions/concerns: [address], Cardiac Imaging Nurse Navigator Rockwell Alexandria, Cardiac Imaging Nurse Navigator [facility] Direct Office Dial: [phone]   For scheduling needs, including cancellations and rescheduling, please call [phone], [phone].    Follow-Up: At [facility], you and your health needs are our priority.  As part of our continuing mission to provide you with exceptional heart care, we have created designated Provider Care Teams.  These Care Teams include your primary Cardiologist (physician) and Advanced Practice Providers (APPs -  Physician Assistants and Nurse Practitioners) who all work together to provide you with the care you need, when you need it.  We recommend signing up for the patient portal called "MyChart".  Sign up information is provided on this  After Visit Summary.  MyChart is used to connect with patients for Virtual Visits (Telemedicine).  Patients are able to view lab/test results, encounter notes, upcoming appointments, etc.  Non-urgent messages can be sent to your provider as well.   To learn more about what you can do with MyChart, go to [email or web address].    Your next appointment:   2  month(s)  The format for your next appointment:   In Person  Provider:   Jenne Campus, MD   Other Instructions Cardiac CT Angiogram A cardiac CT angiogram is a procedure to look at the heart and the area around the heart. It may be done to help find the cause of chest pains or other symptoms of heart disease. During this procedure, a substance called contrast dye is injected into the blood vessels in the area to be checked. A large X-ray machine, called a CT scanner, then takes detailed pictures of the heart and the surrounding area. The procedure is also sometimes called a coronary CT angiogram, coronary artery scanning, or CTA. A cardiac CT angiogram allows the health care provider to see how well blood is flowing to and from the heart. The health care provider will be able to see if there are any problems, such as: Blockage or narrowing of the coronary arteries in the heart. Fluid around the heart. Signs of weakness or disease in the muscles, valves, and tissues of the heart. Tell a health care provider about: Any allergies you have. This is especially important if you have had a previous allergic reaction to contrast dye. All medicines you are taking, including vitamins, herbs, eye drops, creams, and over-the-counter medicines. Any blood disorders you have. Any surgeries you have had. Any medical conditions you have. Whether you are pregnant or may be pregnant. Any anxiety disorders, chronic pain, or other conditions you have that may increase your stress or prevent you from lying still. What are the risks? Generally, this is a safe procedure. However, problems may occur, including: Bleeding. Infection. Allergic reactions to medicines or dyes. Damage to other structures or organs. Kidney damage from the contrast dye that is used. Increased risk of cancer from radiation exposure. This risk is low. Talk with your health care provider about: The risks and benefits of  testing. How you can receive the lowest dose of radiation. What happens before the procedure? Wear comfortable clothing and remove any jewelry, glasses, dentures, and hearing aids. Follow instructions from your health care provider about eating and drinking. This may include: For 12 hours before the procedure -- avoid caffeine. This includes tea, coffee, soda, energy drinks, and diet pills. Drink plenty of water or other fluids that do not have caffeine in them. Being well hydrated can prevent complications. For 4-6 hours before the procedure -- stop eating and drinking. The contrast dye can cause nausea, but this is less likely if your stomach is empty. Ask your health care provider about changing or stopping your regular medicines. This is especially important if you are taking diabetes medicines, blood thinners, or medicines to treat problems with erections (erectile dysfunction). What happens during the procedure?  Hair on your chest may need to be removed so that small sticky patches called electrodes can be placed on your chest. These will transmit information that helps to monitor your heart during the procedure. An IV will be inserted into one of your veins. You might be given a medicine to control your heart rate during the procedure. This will help to ensure  that good images are obtained. You will be asked to lie on an exam table. This table will slide in and out of the CT machine during the procedure. Contrast dye will be injected into the IV. You might feel warm, or you may get a metallic taste in your mouth. You will be given a medicine called nitroglycerin. This will relax or dilate the arteries in your heart. The table that you are lying on will move into the CT machine tunnel for the scan. The person running the machine will give you instructions while the scans are being done. You may be asked to: Keep your arms above your head. Hold your breath. Stay very still, even if the  table is moving. When the scanning is complete, you will be moved out of the machine. The IV will be removed. The procedure may vary among health care providers and hospitals. What can I expect after the procedure? After your procedure, it is common to have: A metallic taste in your mouth from the contrast dye. A feeling of warmth. A headache from the nitroglycerin. Follow these instructions at home: Take over-the-counter and prescription medicines only as told by your health care provider. If you are told, drink enough fluid to keep your urine pale yellow. This will help to flush the contrast dye out of your body. Most people can return to their normal activities right after the procedure. Ask your health care provider what activities are safe for you. It is up to you to get the results of your procedure. Ask your health care provider, or the department that is doing the procedure, when your results will be ready. Keep all follow-up visits as told by your health care provider. This is important. Contact a health care provider if: You have any symptoms of allergy to the contrast dye. These include: Shortness of breath. Rash or hives. A racing heartbeat. Summary A cardiac CT angiogram is a procedure to look at the heart and the area around the heart. It may be done to help find the cause of chest pains or other symptoms of heart disease. During this procedure, a large X-ray machine, called a CT scanner, takes detailed pictures of the heart and the surrounding area after a contrast dye has been injected into blood vessels in the area. Ask your health care provider about changing or stopping your regular medicines before the procedure. This is especially important if you are taking diabetes medicines, blood thinners, or medicines to treat erectile dysfunction. If you are told, drink enough fluid to keep your urine pale yellow. This will help to flush the contrast dye out of your body. This  information is not intended to replace advice given to you by your health care provider. Make sure you discuss any questions you have with your health care provider. Document Revised: 02/27/2019 Document Reviewed: 02/27/2019 Elsevier Patient Education  2020 Elsevier Inc.    NO PREPARATION FOR THIS TEST: Your physician has requested that you have an echocardiogram. Echocardiography is a painless test that uses sound waves to create images of your heart. It provides your doctor with information about the size and shape of your heart and how well your heart's chambers and valves are working. This procedure takes approximately one hour. There are no restrictions for this procedure.

## 2022-02-22 ENCOUNTER — Ambulatory Visit (INDEPENDENT_AMBULATORY_CARE_PROVIDER_SITE_OTHER): Payer: BC Managed Care – PPO

## 2022-02-22 DIAGNOSIS — R0609 Other forms of dyspnea: Secondary | ICD-10-CM

## 2022-02-22 LAB — ECHOCARDIOGRAM COMPLETE
Area-P 1/2: 3.12 cm2
S' Lateral: 3.1 cm

## 2022-03-03 ENCOUNTER — Telehealth: Payer: Self-pay

## 2022-03-03 NOTE — Telephone Encounter (Signed)
Results reviewed with pt as per Dr. Krasowski's note.  Pt verbalized understanding and had no additional questions. Routed to PCP  

## 2022-03-11 ENCOUNTER — Telehealth (HOSPITAL_COMMUNITY): Payer: Self-pay | Admitting: Emergency Medicine

## 2022-03-11 NOTE — Telephone Encounter (Signed)
Reaching out to patient to offer assistance regarding upcoming cardiac imaging study; pt verbalizes understanding of appt date/time, parking situation and where to check in, pre-test NPO status and medications ordered, and verified current allergies; name and call back number provided for further questions should they arise Rockwell Alexandria RN Navigator Cardiac Imaging [facility] [phone] office [phone] cell  Arrival 330 w/c entrance  100mg  metoprolol  Denies iv issues Aware nitro

## 2022-03-14 ENCOUNTER — Ambulatory Visit (HOSPITAL_COMMUNITY)
Admission: RE | Admit: 2022-03-14 | Discharge: 2022-03-14 | Disposition: A | Discharge status: Home / Self Care | Payer: BC Managed Care – PPO | Source: Ambulatory Visit | Attending: Cardiology | Admitting: Cardiology

## 2022-03-14 DIAGNOSIS — R072 Precordial pain: Secondary | ICD-10-CM | POA: Insufficient documentation

## 2022-03-14 MED ORDER — NITROGLYCERIN 0.4 MG SL SUBL
0.8000 mg | SUBLINGUAL_TABLET | Freq: Once | SUBLINGUAL | Status: AC
  Administered 2022-03-14: 0.8 mg via SUBLINGUAL

## 2022-03-14 MED ORDER — NITROGLYCERIN 0.4 MG SL SUBL
SUBLINGUAL_TABLET | SUBLINGUAL | Status: AC
  Filled 2022-03-14: qty 2

## 2022-03-14 MED ORDER — IOHEXOL 350 MG/ML SOLN
100.0000 mL | Freq: Once | INTRAVENOUS | Status: AC | PRN
  Administered 2022-03-14: 100 mL via INTRAVENOUS

## 2022-03-15 ENCOUNTER — Telehealth: Payer: Self-pay

## 2022-03-15 NOTE — Telephone Encounter (Signed)
Results reviewed with pt as per Dr. Krasowski's note.  Pt verbalized understanding and had no additional questions. Routed to PCP  

## 2022-04-04 DIAGNOSIS — M109 Gout, unspecified: Secondary | ICD-10-CM | POA: Diagnosis not present

## 2022-04-04 DIAGNOSIS — Z6829 Body mass index (BMI) 29.0-29.9, adult: Secondary | ICD-10-CM | POA: Diagnosis not present

## 2022-04-13 DIAGNOSIS — Z79899 Other long term (current) drug therapy: Secondary | ICD-10-CM | POA: Diagnosis not present

## 2022-04-13 DIAGNOSIS — E785 Hyperlipidemia, unspecified: Secondary | ICD-10-CM | POA: Diagnosis not present

## 2022-04-13 DIAGNOSIS — Z1329 Encounter for screening for other suspected endocrine disorder: Secondary | ICD-10-CM | POA: Diagnosis not present

## 2022-04-13 DIAGNOSIS — M109 Gout, unspecified: Secondary | ICD-10-CM | POA: Diagnosis not present

## 2022-04-13 DIAGNOSIS — I10 Essential (primary) hypertension: Secondary | ICD-10-CM | POA: Diagnosis not present

## 2022-04-13 DIAGNOSIS — N400 Enlarged prostate without lower urinary tract symptoms: Secondary | ICD-10-CM | POA: Diagnosis not present

## 2022-05-17 ENCOUNTER — Encounter: Payer: Self-pay | Admitting: Cardiology

## 2022-05-17 ENCOUNTER — Ambulatory Visit: Payer: BC Managed Care – PPO | Attending: Cardiology | Admitting: Cardiology

## 2022-05-17 VITALS — BP 160/90 | HR 64 | Ht 71.0 in | Wt 219.8 lb

## 2022-05-17 DIAGNOSIS — I2510 Atherosclerotic heart disease of native coronary artery without angina pectoris: Secondary | ICD-10-CM

## 2022-05-17 DIAGNOSIS — E785 Hyperlipidemia, unspecified: Secondary | ICD-10-CM | POA: Diagnosis not present

## 2022-05-17 DIAGNOSIS — I10 Essential (primary) hypertension: Secondary | ICD-10-CM

## 2022-05-17 MED ORDER — ATORVASTATIN CALCIUM 10 MG PO TABS: 10.0000 mg | ORAL_TABLET | Freq: Every day | ORAL | 3 refills | Status: AC

## 2022-05-17 NOTE — Patient Instructions (Addendum)
Medication Instructions:  Your physician has recommended you make the following change in your medication:   Start Lipitor 10 mg daily  *If you need a refill on your cardiac medications before your next appointment, please call your pharmacy*   Lab Work: Your physician recommends that you return for lab work in: 6 weeks You need to have labs done when you are fasting.  You can come Monday through Friday 8:30 am to 12:00 pm and 1:15 to 4:30. You do not need to make an appointment as the order has already been placed. The labs you are going to have done are BMET and Lft's.  If you have labs (blood work) drawn today and your tests are completely normal, you will receive your results only by: [place] (if you have MyChart) OR A paper copy in the mail If you have any lab test that is abnormal or we need to change your treatment, we will call you to review the results.   Testing/Procedures: None ordered   Follow-Up: At [facility], you and your health needs are our priority.  As part of our continuing mission to provide you with exceptional heart care, we have created designated Provider Care Teams.  These Care Teams include your primary Cardiologist (physician) and Advanced Practice Providers (APPs -  Physician Assistants and Nurse Practitioners) who all work together to provide you with the care you need, when you need it.  We recommend signing up for the patient portal called "MyChart".  Sign up information is provided on this After Visit Summary.  MyChart is used to connect with patients for Virtual Visits (Telemedicine).  Patients are able to view lab/test results, encounter notes, upcoming appointments, etc.  Non-urgent messages can be sent to your provider as well.   To learn more about what you can do with MyChart, go to [email or web address].    Your next appointment:   12 month(s)  The format for your next appointment:   In Person  Provider:   Jenne Campus,  MD   Other Instructions NA

## 2022-05-17 NOTE — Progress Notes (Unsigned)
Cardiology Office Note:    Date:  05/17/2022   ID:  David Golden, DOB 08-11-1965, MRN [id number]  PCP:  Helen Hashimoto., MD  Cardiologist:  Jenne Campus, MD    Referring MD: Helen Hashimoto., MD   Chief Complaint  Patient presents with   Results    CT results    History of Present Illness:    David Golden is a 56 y.o. male with past medical history significant for dyslipidemia, essential hypertension.  Came to my office for follow-up after coronary CT angio.  Initial reason for consult was atypical chest pain.  Coronary CT angio shows soft soft very small plaque located in circumflex artery.  Otherwise everything looked normal he also have enlargement of the aorta measuring 40 mm.  He denies have any chest pain tightness squeezing pressure burning chest.  He does what he wants to do  Past Medical History:  Diagnosis Date   Chronic kidney disease    right  kidney stone   Coronavirus infection 07/07/2019   Formatting of this note might be different from the original. Notification of POS result Isolation since day of testing of patient and family members [place] Dept will call with specific instructions Conv Care their POC until the [facility] contacts them Obtaining testing for symptomatic Family Members Discuss self-isolation measures Discuss in-home separation and safety measures Discuss web-based C   Gout 6 months ago   Hyperlipidemia    Hypertension     Past Surgical History:  Procedure Laterality Date   CYSTOSCOPY WITH STENT PLACEMENT  march 2013   KNEE ARTHROSCOPY Bilateral 2012    Current Medications: Current Meds  Medication Sig   allopurinol (ZYLOPRIM) 100 MG tablet Take 100 mg by mouth daily.   febuxostat (ULORIC) 40 MG tablet Take 40 mg by mouth daily.   lisinopril (ZESTRIL) 10 MG tablet Take 10 mg by mouth daily.   metoprolol succinate (TOPROL-XL) 25 MG 24 hr tablet Take 25 mg by mouth daily.   [DISCONTINUED] metoprolol tartrate (LOPRESSOR) 100 MG  tablet Take one tablet 2 hours before cardiac CT for heart greater than 55 (Patient taking differently: 2 (two) times daily. Take one tablet 2 hours before cardiac CT for heart greater than 55)     Allergies:   Patient has no known allergies.   Social History   Socioeconomic History   Marital status: Married    Spouse name: Not on file   Number of children: Not on file   Years of education: Not on file   Highest education level: Not on file  Occupational History   Not on file  Tobacco Use   Smoking status: Never   Smokeless tobacco: Not on file  Substance and Sexual Activity   Alcohol use: No   Drug use: No   Sexual activity: Yes  Other Topics Concern   Not on file  Social History Narrative   Not on file   Social Determinants of Health   Financial Resource Strain: Not on file  Food Insecurity: Not on file  Transportation Needs: Not on file  Physical Activity: Not on file  Stress: Not on file  Social Connections: Not on file     Family History: The patient's family history includes Arthritis in his father; Cancer in his father; Heart attack in his father; Heart disease in his father and mother. ROS:   Please see the history of present illness.    All 14 point review of systems negative except as  described per history of present illness  EKGs/Labs/Other Studies Reviewed:      Recent Labs: No results found for requested labs within last 365 days.  Recent Lipid Panel No results found for: "CHOL", "TRIG", "HDL", "CHOLHDL", "VLDL", "LDLCALC", "LDLDIRECT"  Physical Exam:    VS:  BP (!) 160/90 (BP Location: Right Arm, Patient Position: Sitting)   Pulse 64   Ht 5\' 11"  (1.803 m)   Wt 219 lb 12.8 oz (99.7 kg)   SpO2 94%   BMI 30.66 kg/m     Wt Readings from Last 3 Encounters:  05/17/22 219 lb 12.8 oz (99.7 kg)  02/18/22 213 lb (96.6 kg)  12/20/21 211 lb (95.7 kg)     GEN:  Well nourished, well developed in no acute distress HEENT: Normal NECK: No JVD; No  carotid bruits LYMPHATICS: No lymphadenopathy CARDIAC: RRR, no murmurs, no rubs, no gallops RESPIRATORY:  Clear to auscultation without rales, wheezing or rhonchi  ABDOMEN: Soft, non-tender, non-distended MUSCULOSKELETAL:  No edema; No deformity  SKIN: Warm and dry LOWER EXTREMITIES: no swelling NEUROLOGIC:  Alert and oriented x 3 PSYCHIATRIC:  Normal affect   ASSESSMENT:    1. Essential hypertension   2. Coronary artery disease involving native coronary artery of native heart without angina pectoris   3. Dyslipidemia    PLAN:    In order of problems listed above:  Coronary artery disease only minimal plaquing in the circumflex artery but enough to initiate antiplatelets therapy as well as statin therapy I asked him to start taking baby aspirin every day as well as Lipitor 10 mg daily fasting lipid profile will be checked within next 6 weeks with AST ALT Dyslipidemia plan as above Essential hypertension we will recheck his blood pressure today before leaving the room Enlargement of the aorta aorta measuring 40 mm.  That need to be follow-up few years later.  Of course the key is to keep his blood pressure under control   Medication Adjustments/Labs and Tests Ordered: Current medicines are reviewed at length with the patient today.  Concerns regarding medicines are outlined above.  No orders of the defined types were placed in this encounter.  Medication changes: No orders of the defined types were placed in this encounter.   Signed, 02/19/22, MD, [facility] 05/17/2022 3:00 PM     [facility]

## 2022-08-10 DIAGNOSIS — E785 Hyperlipidemia, unspecified: Secondary | ICD-10-CM | POA: Diagnosis not present

## 2022-08-10 DIAGNOSIS — M109 Gout, unspecified: Secondary | ICD-10-CM | POA: Diagnosis not present

## 2022-08-10 DIAGNOSIS — I10 Essential (primary) hypertension: Secondary | ICD-10-CM | POA: Diagnosis not present

## 2022-08-10 DIAGNOSIS — N400 Enlarged prostate without lower urinary tract symptoms: Secondary | ICD-10-CM | POA: Diagnosis not present

## 2022-08-10 DIAGNOSIS — Z6830 Body mass index (BMI) 30.0-30.9, adult: Secondary | ICD-10-CM | POA: Diagnosis not present

## 2022-09-13 ENCOUNTER — Telehealth: Payer: Self-pay

## 2022-09-13 NOTE — Patient Outreach (Signed)
  Care Coordination   Initial Visit Note   09/13/2022 Name: David Golden MRN: [id number] DOB: 28-Feb-1966  David Golden is a 57 y.o. year old male who sees David Golden., MD for primary care. I spoke with  David Golden by phone today.  What matters to the patients health and wellness today?  Placed call to patient to review and offer [facility] care coordination program.  Patient reports that he is doing well and denies any needs today.    SDOH assessments and interventions completed:  No     Care Coordination Interventions:  No, not indicated   Follow up plan: No further intervention required.   Encounter Outcome:  Pt. Refused   Tomasa Rand, RN, BSN, CEN [facility] [employer] [phone]

## 2022-10-10 DIAGNOSIS — M170 Bilateral primary osteoarthritis of knee: Secondary | ICD-10-CM | POA: Diagnosis not present

## 2022-10-10 DIAGNOSIS — M25561 Pain in right knee: Secondary | ICD-10-CM | POA: Diagnosis not present

## 2022-10-20 DIAGNOSIS — M25561 Pain in right knee: Secondary | ICD-10-CM | POA: Diagnosis not present

## 2022-10-24 DIAGNOSIS — M1711 Unilateral primary osteoarthritis, right knee: Secondary | ICD-10-CM | POA: Diagnosis not present

## 2022-10-31 DIAGNOSIS — M1711 Unilateral primary osteoarthritis, right knee: Secondary | ICD-10-CM | POA: Diagnosis not present

## 2022-11-07 DIAGNOSIS — M1711 Unilateral primary osteoarthritis, right knee: Secondary | ICD-10-CM | POA: Diagnosis not present

## 2022-11-23 DIAGNOSIS — M1711 Unilateral primary osteoarthritis, right knee: Secondary | ICD-10-CM | POA: Diagnosis not present

## 2023-11-22 DIAGNOSIS — M25531 Pain in right wrist: Secondary | ICD-10-CM | POA: Diagnosis not present
